# Patient Record
Sex: Male | Born: 2000 | Race: White | Hispanic: No | Marital: Single | Smoking: Never smoker
Health system: Southern US, Community
[De-identification: ages and names within clinical notes are randomized; demographics above are authoritative.]

---
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
## 2020-02-05 ENCOUNTER — Institutional Professional Consult (permissible substitution): Payer: No Typology Code available for payment source | Admitting: Pediatrics

## 2020-02-21 ENCOUNTER — Other Ambulatory Visit: Payer: Self-pay | Admitting: Pediatrics

## 2020-02-22 NOTE — Telephone Encounter (Signed)
E-Prescribed sertraline 50 directly to  [facility] - [place], [place] - [phone] [place] [address] Phone: [phone] Fax: [phone]

## 2020-02-22 NOTE — Telephone Encounter (Signed)
Last visit 11/06/2019 next visit 03/15/2020

## 2020-02-24 ENCOUNTER — Other Ambulatory Visit: Payer: Self-pay | Admitting: Pediatrics

## 2020-02-24 NOTE — Telephone Encounter (Signed)
RX for above e-scribed and sent to pharmacy on record  [facility] - [place], [place] - [address] [place] [address] Phone: [phone] Fax: [phone]   Has appt 03/15/20

## 2020-03-15 ENCOUNTER — Ambulatory Visit (INDEPENDENT_AMBULATORY_CARE_PROVIDER_SITE_OTHER): Payer: 59 | Admitting: Pediatrics

## 2020-03-15 ENCOUNTER — Other Ambulatory Visit: Payer: Self-pay

## 2020-03-15 ENCOUNTER — Encounter: Payer: Self-pay | Admitting: Pediatrics

## 2020-03-15 VITALS — Ht 69.5 in | Wt 138.0 lb

## 2020-03-15 DIAGNOSIS — R278 Other lack of coordination: Secondary | ICD-10-CM

## 2020-03-15 DIAGNOSIS — F902 Attention-deficit hyperactivity disorder, combined type: Secondary | ICD-10-CM

## 2020-03-15 DIAGNOSIS — Z79899 Other long term (current) drug therapy: Secondary | ICD-10-CM

## 2020-03-15 DIAGNOSIS — Z7189 Other specified counseling: Secondary | ICD-10-CM

## 2020-03-15 DIAGNOSIS — Z719 Counseling, unspecified: Secondary | ICD-10-CM | POA: Diagnosis not present

## 2020-03-15 MED ORDER — ATOMOXETINE HCL 60 MG PO CAPS: 60.0000 mg | ORAL_CAPSULE | Freq: Every morning | ORAL | 4 refills | Status: DC

## 2020-03-15 MED ORDER — SERTRALINE HCL 50 MG PO TABS: 50.0000 mg | ORAL_TABLET | Freq: Every day | ORAL | 4 refills | Status: DC

## 2020-03-15 NOTE — Progress Notes (Signed)
Medication Check  Patient ID: Randy Allison  DOB: [birth year]  MRN: [id number]  DATE:03/15/20 Randy Salmon, MD  Accompanied by: Father Patient Lives with: mother, father and sister age 19  HISTORY/CURRENT STATUS: Chief Complaint - Polite and cooperative and present for medical follow up for medication management of ADHD, dysgraphia and learning differences. Last follow up November 06, 2019 and currently prescribed Strattera 60 mg and Zoloft 50 mg 1 1/2 tablets.  Recently had braces removed.   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  No Sleep concerns.  Elimination: No concerns  Individual Medical History/ Review of Systems: Changes? :No Has Girl friend for about 3 months, will be doing [place] on-line  Family Medical/ Social History: Changes? No  Current Medications:  Strattera 60 mg Zoloft 50 mg one and half daily Medication Side Effects: None  MENTAL HEALTH: Mental Health Issues:  Denies sadness, loneliness or depression. No self harm or thoughts of self harm or injury. Denies fears, worries and anxieties. Has good peer relations and is not a bully nor is victimized.  Review of Systems  Constitutional: Negative.   HENT: Negative.   Eyes: Negative.   Respiratory: Negative.   Cardiovascular: Negative.   Gastrointestinal: Negative.   Genitourinary: Negative.   Musculoskeletal: Negative.   Skin: Negative.   Allergic/Immunologic: Negative.   Neurological:  Negative for tremors, seizures, syncope, speech difficulty and headaches.  Psychiatric/Behavioral: Positive for sleep disturbance. Negative for agitation, behavioral problems, confusion, decreased concentration, dysphoric mood, hallucinations and suicidal ideas. The patient is nervous/anxious. The patient is not hyperactive.   All other systems reviewed and are negative.   PHYSICAL EXAM; Vitals:   03/15/20 1508  Weight: 138 lb (62.6 kg)  Height: 5' 9.5" (1.765 m)   Body mass index is 20.09 kg/m.  General Physical Exam: Unchanged from previous exam, date:11/06/19   DIAGNOSES:    ICD-10-CM   1. ADHD (attention deficit hyperactivity disorder), combined type  F90.2   2. Dysgraphia  R27.8   3. Medication management  Z79.899   4. Patient counseled  Z71.9   5. Parenting dynamics counseling  Z71.89   6. Counseling and coordination of care  Z71.89     RECOMMENDATIONS:  Patient Instructions  DISCUSSION: Counseled regarding the following coordination of care items:  Continue medication as directed Strattera 60 mg Zoloft 50 mg 1-2 daily  RX for above e-scribed and sent to pharmacy on record  [facility] - [place], [place] - [address] [place] [address] Phone: [phone] Fax: [phone]   Counseled regarding obtaining refills by calling pharmacy first to use automated refill request then if needed, call our office leaving a detailed message on the refill line.  Counseled medication administration, effects, and possible side effects.  ADHD medications discussed to include different medications and pharmacologic properties of each. Recommendation for specific medication to include dose, administration, expected effects, possible side effects and the risk to benefit ratio of medication management.  Advised importance of:  Good sleep hygiene (8- 10  hours per night)  Limited screen time (none on school nights, no more than 2 hours on  weekends)  Regular exercise(outside and active play)  Healthy eating (drink water, no sodas/sweet tea)  Regular family meals have been linked to lower levels of adolescent risk-taking behavior.  Adolescents who frequently eat meals with their family are less likely to engage in risk behaviors than those who never or rarely eat with their families.  So it is never too early to start this tradition.  Counseling at this visit included the review of old records and/or current chart.   Counseling included the following discussion points presented at every visit to improve understanding and treatment compliance.  Recent health history and today's examination Growth and development with anticipatory guidance provided regarding brain growth, executive function maturation and pre or pubertal development. School progress and continued advocay for appropriate accommodations to include maintain Structure, routine, organization, reward, motivation and consequences.  Additionally the patient was counseled to take medication while driving.     Father verbalized understanding of all topics discussed.  NEXT APPOINTMENT:  Return in about 6 months (around 09/15/2020) for Medical Follow up.  Medical Decision-making: More than 50% of the appointment was spent counseling and discussing diagnosis and management of symptoms with the patient and family.  Counseling Time: 25 minutes Total Contact Time: 30 minutes

## 2020-03-15 NOTE — Patient Instructions (Signed)
DISCUSSION: Counseled regarding the following coordination of care items:  Continue medication as directed Strattera 60 mg Zoloft 50 mg 1-2 daily  RX for above e-scribed and sent to pharmacy on record  [facility] - [place],  - [address] [place] [address] Phone: [phone] Fax: [phone]   Counseled regarding obtaining refills by calling pharmacy first to use automated refill request then if needed, call our office leaving a detailed message on the refill line.  Counseled medication administration, effects, and possible side effects.  ADHD medications discussed to include different medications and pharmacologic properties of each. Recommendation for specific medication to include dose, administration, expected effects, possible side effects and the risk to benefit ratio of medication management.  Advised importance of:  Good sleep hygiene (8- 10 hours per night)  Limited screen time (none on school nights, no more than 2 hours on weekends)  Regular exercise(outside and active play)  Healthy eating (drink water, no sodas/sweet tea)  Regular family meals have been linked to lower levels of adolescent risk-taking behavior.  Adolescents who frequently eat meals with their family are less likely to engage in risk behaviors than those who never or rarely eat with their families.  So it is never too early to start this tradition.  Counseling at this visit included the review of old records and/or current chart.   Counseling included the following discussion points presented at every visit to improve understanding and treatment compliance.  Recent health history and today's examination Growth and development with anticipatory guidance provided regarding brain growth, executive function maturation and pre or pubertal development. School progress and continued advocay for appropriate accommodations to include maintain Structure,  routine, organization, reward, motivation and consequences.  Additionally the patient was counseled to take medication while driving.

## 2020-03-23 ENCOUNTER — Other Ambulatory Visit: Payer: Self-pay | Admitting: Pediatrics

## 2020-08-16 ENCOUNTER — Institutional Professional Consult (permissible substitution): Payer: 59 | Admitting: Pediatrics

## 2020-08-24 ENCOUNTER — Other Ambulatory Visit: Payer: Self-pay

## 2020-08-24 ENCOUNTER — Encounter: Payer: Self-pay | Admitting: Pediatrics

## 2020-08-24 ENCOUNTER — Ambulatory Visit (INDEPENDENT_AMBULATORY_CARE_PROVIDER_SITE_OTHER): Payer: 59 | Admitting: Pediatrics

## 2020-08-24 VITALS — Ht 69.5 in | Wt 143.0 lb

## 2020-08-24 DIAGNOSIS — R278 Other lack of coordination: Secondary | ICD-10-CM

## 2020-08-24 DIAGNOSIS — F902 Attention-deficit hyperactivity disorder, combined type: Secondary | ICD-10-CM | POA: Diagnosis not present

## 2020-08-24 DIAGNOSIS — F411 Generalized anxiety disorder: Secondary | ICD-10-CM | POA: Diagnosis not present

## 2020-08-24 DIAGNOSIS — Z7189 Other specified counseling: Secondary | ICD-10-CM

## 2020-08-24 DIAGNOSIS — Z719 Counseling, unspecified: Secondary | ICD-10-CM

## 2020-08-24 DIAGNOSIS — Z79899 Other long term (current) drug therapy: Secondary | ICD-10-CM

## 2020-08-24 MED ORDER — ATOMOXETINE HCL 60 MG PO CAPS: 60.0000 mg | ORAL_CAPSULE | Freq: Every morning | ORAL | 2 refills | Status: DC

## 2020-08-24 MED ORDER — SERTRALINE HCL 50 MG PO TABS: 25.0000 mg | ORAL_TABLET | Freq: Every day | ORAL | 2 refills | Status: DC

## 2020-08-24 NOTE — Patient Instructions (Signed)
DISCUSSION: Counseled regarding the following coordination of care items:  Continue medication as directed Strattera 60 mg every day Sertraline 25 mg every morning RX for above e-scribed and sent to pharmacy on record  [facility] - [place], [place] - [address] [place] [address] Phone: [phone] Fax: [phone]   Will keep medication the same until results of PGT swab are returned, sample sent this date due to challenges with medication, response and Side effects.  Counseled regarding obtaining refills by calling pharmacy first to use automated refill request then if needed, call our office leaving a detailed message on the refill line.  Counseled medication administration, effects, and possible side effects.  ADHD medications discussed to include different medications and pharmacologic properties of each. Recommendation for specific medication to include dose, administration, expected effects, possible side effects and the risk to benefit ratio of medication management.  Advised importance of:  Good sleep hygiene (8- 10 hours per night)  Limited screen time (none on school nights, no more than 2 hours on weekends)  Regular exercise(outside and active play)  Healthy eating (drink water, no sodas/sweet tea)  Counseling at this visit included the review of old records and/or current chart.   Counseling included the following discussion points presented at every visit to improve understanding and treatment compliance.  Recent health history and today's examination Growth and development with anticipatory guidance provided regarding brain growth, executive function maturation and pre or pubertal development. School progress and continued advocay for appropriate accommodations to include maintain Structure, routine, organization, reward, motivation and consequences.  Additionally the patient was counseled to take medication while  driving.

## 2020-08-24 NOTE — Progress Notes (Signed)
Medication Check  Patient ID: Randy Allison  DOB: [birth year]  MRN: [id number]  DATE:08/24/20 Randy Salmon, MD  Accompanied by: Mother Patient Lives with: mother and father  HISTORY/CURRENT STATUS: Chief Complaint - Polite and cooperative and present for medical follow up for medication management of ADHD, dysgraphia and  Anxiety with episodic depression. Last follow up on 03/15/2020.  Currently prescribed Strattera 60 mg and Zoloft 25 mg daily.  Has emailed with request to return to baseline and reassess medication, but stress and response is not flaring at present.  On break from school.  When was during school, would feel anxiety and would increase zoloft and feel foggy.  Whenever skipped strattera would feel headache and feel badly.  Would like to adjust medication to have a better response but is afraid to do so due to feeling badly and returning to college.  May wish to adjust over summer.  Maternal history of significantly reduced MTHFR.  EDUCATION: School: [employer] Year/Grade: Freshman Variable up and down with depression through the first semester, would not want to do things for about four to five days and did impact school work, was able to get it adjusted with some emotions but powered through. Zero stress right now.  Passed all classes - took Research scientist (medical), seminar, Am Hist, [place], mass media, writing  Mass media was stressful due to not sure of grades Advised needs DSO at school, has not yet submitted paperwork  Activities/ Exercise: daily  Screen time: (phone, tablet, TV, computer): not excessive  Driving: going well  MEDICAL HISTORY: Appetite: WNL   Sleep: Bedtime: good now  Awakens: variable   Concerns: Initiation/Maintenance/Other: Asleep easily, sleeps through the night, feels well-rested.  No Sleep concerns.  Elimination: no concerns  Individual Medical History/ Review of Systems: Changes? :No  Family Medical/ Social History: Changes? No  Current Medications:   strattera 60 mg every morning Sertraline 25 mg daily Medication Side Effects: None  MENTAL HEALTH: Mental Health Issues:  Denies sadness, loneliness or depression. No self harm or thoughts of self harm or injury. Denies fears, worries and anxieties. Has good peer relations and is not a bully nor is victimized.  Review of Systems  Constitutional: Negative.   HENT: Negative.   Eyes: Negative.   Respiratory: Negative.   Cardiovascular: Negative.   Gastrointestinal: Negative.   Genitourinary: Negative.   Musculoskeletal: Negative.   Skin: Negative.   Allergic/Immunologic: Negative.   Neurological: Negative for tremors, seizures, syncope, speech difficulty and headaches.  Psychiatric/Behavioral: Negative for agitation, behavioral problems, confusion, decreased concentration, dysphoric mood, hallucinations, sleep disturbance and suicidal ideas. The patient is nervous/anxious. The patient is not hyperactive.   All other systems reviewed and are negative.   PHYSICAL EXAM; Vitals:   08/24/20 0935  Weight: 143 lb (64.9 kg)  Height: 5' 9.5" (1.765 m)   Body mass index is 20.81 kg/m.  General Physical Exam: Unchanged from previous exam, date:03/15/20   DIAGNOSES:    ICD-10-CM   1. ADHD (attention deficit hyperactivity disorder), combined type  F90.2 Pharmacogenomic Testing/PersonalizeDx  2. Dysgraphia  R27.8 Pharmacogenomic Testing/PersonalizeDx  3. Generalized anxiety disorder  F41.1 Pharmacogenomic Testing/PersonalizeDx  4. Medication management  Z79.899   5. Patient counseled  Z71.9   6. Parenting dynamics counseling  Z71.89   7. Counseling and coordination of care  Z71.89     RECOMMENDATIONS:  Patient Instructions  DISCUSSION: Counseled regarding the following coordination of care items:  Continue medication as directed Strattera 60 mg every day Sertraline 25 mg every morning RX  for above e-scribed and sent to pharmacy on record  [facility]  - [place], [place] - [address] [place] [address] Phone: [phone] Fax: [phone]   Will keep medication the same until results of PGT swab are returned, sample sent this date due to challenges with medication, response and Side effects.  Counseled regarding obtaining refills by calling pharmacy first to use automated refill request then if needed, call our office leaving a detailed message on the refill line.  Counseled medication administration, effects, and possible side effects.  ADHD medications discussed to include different medications and pharmacologic properties of each. Recommendation for specific medication to include dose, administration, expected effects, possible side effects and the risk to benefit ratio of medication management.  Advised importance of:  Good sleep hygiene (8- 10 hours per night)  Limited screen time (none on school nights, no more than 2 hours on weekends)  Regular exercise(outside and active play)  Healthy eating (drink water, no sodas/sweet tea)  Counseling at this visit included the review of old records and/or current chart.   Counseling included the following discussion points presented at every visit to improve understanding and treatment compliance.  Recent health history and today's examination Growth and development with anticipatory guidance provided regarding brain growth, executive function maturation and pre or pubertal development. School progress and continued advocay for appropriate accommodations to include maintain Structure, routine, organization, reward, motivation and consequences.  Additionally the patient was counseled to take medication while driving.      Mother verbalized understanding of all topics discussed.  NEXT APPOINTMENT:  Return in about 3 months (around 11/22/2020) for Medication Check.  Medical Decision-making: More than 50% of the appointment was spent counseling and discussing  diagnosis and management of symptoms with the patient and family.  Counseling Time: 40 minutes Total Contact Time: 50 minutes

## 2020-08-25 ENCOUNTER — Other Ambulatory Visit (HOSPITAL_COMMUNITY): Payer: Self-pay | Admitting: Medical

## 2020-08-25 DIAGNOSIS — R1013 Epigastric pain: Secondary | ICD-10-CM

## 2020-08-29 ENCOUNTER — Other Ambulatory Visit: Payer: Self-pay

## 2020-08-29 ENCOUNTER — Ambulatory Visit (INDEPENDENT_AMBULATORY_CARE_PROVIDER_SITE_OTHER): Payer: 59

## 2020-08-29 DIAGNOSIS — R1013 Epigastric pain: Secondary | ICD-10-CM | POA: Diagnosis not present

## 2020-08-31 ENCOUNTER — Telehealth: Payer: Self-pay | Admitting: Pediatrics

## 2020-08-31 NOTE — Telephone Encounter (Signed)
Emailed mother and patient PGT report.  No changes at present and MTHFR activity is normal.  

## 2020-11-25 ENCOUNTER — Other Ambulatory Visit: Payer: Self-pay

## 2020-11-25 ENCOUNTER — Encounter: Payer: Self-pay | Admitting: Pediatrics

## 2020-11-25 ENCOUNTER — Ambulatory Visit (INDEPENDENT_AMBULATORY_CARE_PROVIDER_SITE_OTHER): Payer: 59 | Admitting: Pediatrics

## 2020-11-25 VITALS — Ht 69.5 in | Wt 140.0 lb

## 2020-11-25 DIAGNOSIS — F902 Attention-deficit hyperactivity disorder, combined type: Secondary | ICD-10-CM

## 2020-11-25 DIAGNOSIS — R278 Other lack of coordination: Secondary | ICD-10-CM | POA: Diagnosis not present

## 2020-11-25 DIAGNOSIS — Z79899 Other long term (current) drug therapy: Secondary | ICD-10-CM | POA: Diagnosis not present

## 2020-11-25 DIAGNOSIS — Z719 Counseling, unspecified: Secondary | ICD-10-CM

## 2020-11-25 MED ORDER — ATOMOXETINE HCL 60 MG PO CAPS: 60.0000 mg | ORAL_CAPSULE | Freq: Every morning | ORAL | 2 refills | Status: DC

## 2020-11-25 NOTE — Patient Instructions (Signed)
DISCUSSION: Counseled regarding the following coordination of care items:  Continue medication as directed Strattera 60 mg every day RX for above e-scribed and sent to pharmacy on record  [facility] - [place], [place] - [address] [place] [address] Phone: [phone] Fax: [phone]   Discontinue sertraline   Advised importance of:  Sleep Continue with good sleep hygiene with bedtime no later than 2400  Discuss loss of taste and smell due to Covid with PCP

## 2020-11-25 NOTE — Progress Notes (Signed)
Medication Check  Patient ID: Randy Allison  DOB: [birth year]  MRN: [id number]  DATE:11/25/20 [name], MD  Accompanied by: Self Patient Lives with: mother and father  HISTORY/CURRENT STATUS: Chief Complaint - Polite and cooperative and present for medical follow up for medication management of ADHD, dysgraphia and anxiety.  Last visit 08/24/2020.  Had discussed wanting to wean off of sertraline at that time.  Completed a 2-week wean off and did describe having experienced dread and strong emotions while weaning and completely off had two weeks of this random feeling.  Now has had no sertraline since end of February, off five weeks.  Early mid march last intense emotions.  Feels he is doing well being aware of and controlling his emotions.  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 No labs Majoring Business Admin All A except [place] (pulling it up and waiting for grades to get in)  Activities/ Exercise: daily  Hanging with friends, participates in social engagement  [employer] - four applications for work [employer], Lauris Poag, [employer], Karin Golden Was at [employer], register, low pay  Screen time: (phone, tablet, TV, computer): not excessive  Driving: going well  MEDICAL HISTORY: Appetite: WNL   Sleep: Bedtime: School 2300-2400  Awakens: 0930   Concerns: Initiation/Maintenance/Other: Asleep easily, sleeps through the night, feels well-rested.  No Sleep concerns.  Elimination: no concerns  Individual Medical History/ Review of Systems: Changes? :No Fully vaccinated Family Medical/ Social History: Changes? No  MENTAL HEALTH: Denies sadness, loneliness or depression.  Feels level and can be aware and control those feelings. Denies self harm or thoughts of self harm or  injury. Denies fears, worries and anxieties. School work related stress Has good peer relations and is not a bully nor is victimized. Has GF, good relationship.  PHYSICAL EXAM; Vitals:   11/25/20 1511  Weight: 140 lb (63.5 kg)  Height: 5' 9.5" (1.765 m)   Body mass index is 20.38 kg/m.  General Physical Exam: Unchanged from previous exam, date:08/24/20   ASSESSMENT:  Randy Allison is a 20 year old with a diagnosis of ADHD and dysgraphia with resolving anxiety.  Symptoms are improved and well controlled with current Strattera 60 mg every day. He will contact PCP for lingering loss of sense of smell and taste due to past Covid.   ADHD stable with medication management   DIAGNOSES:    ICD-10-CM   1. ADHD (attention deficit hyperactivity disorder), combined type  F90.2   2. Dysgraphia  R27.8   3. Medication management  Z79.899   4. Patient counseled  Z71.9     RECOMMENDATIONS:  Patient Instructions  DISCUSSION: Counseled regarding the following coordination of care items:  Continue medication as directed Strattera 60 mg every day RX for above e-scribed and sent to pharmacy on record  [facility] - [place], [place] - [address] [place] [address] Phone: [phone] Fax: [phone]   Discontinue sertraline   Advised importance of:  Sleep Continue with good sleep hygiene with bedtime no later than 2400  Discuss loss of taste and smell due to Covid with PCP      Patient verbalized understanding of all topics discussed.  NEXT APPOINTMENT:  Return in about 6 months (around 05/27/2021) for Medication Check.

## 2021-05-01 ENCOUNTER — Other Ambulatory Visit: Payer: Self-pay | Admitting: Pediatrics

## 2021-05-02 NOTE — Telephone Encounter (Signed)
RX for above e-scribed and sent to pharmacy on record    [id number] - [place], [place] - [address] [place] [address] Phone: [phone] Fax: [phone]   

## 2021-05-29 ENCOUNTER — Encounter: Payer: Self-pay | Admitting: Pediatrics

## 2021-05-29 ENCOUNTER — Other Ambulatory Visit: Payer: Self-pay

## 2021-05-29 ENCOUNTER — Telehealth (INDEPENDENT_AMBULATORY_CARE_PROVIDER_SITE_OTHER): Payer: 59 | Admitting: Pediatrics

## 2021-05-29 DIAGNOSIS — F902 Attention-deficit hyperactivity disorder, combined type: Secondary | ICD-10-CM | POA: Diagnosis not present

## 2021-05-29 DIAGNOSIS — Z79899 Other long term (current) drug therapy: Secondary | ICD-10-CM

## 2021-05-29 DIAGNOSIS — Z719 Counseling, unspecified: Secondary | ICD-10-CM | POA: Diagnosis not present

## 2021-05-29 DIAGNOSIS — R278 Other lack of coordination: Secondary | ICD-10-CM | POA: Diagnosis not present

## 2021-05-29 NOTE — Patient Instructions (Signed)
DISCUSSION: Counseled regarding the following coordination of care items:  Continue medication as directed Strattera 60 mg daily No refills as they were recently submitted 04/28/2021   Advised importance of:  Sleep Maintain good routines avoiding late nights Limited screen time (none on school nights, no more than 2 hours on weekends) Always reduce screen time Regular exercise(outside and active play) More physical active outside activities and social activities Healthy eating (drink water, no sodas/sweet tea) Protein rich avoiding junk food and empty calories

## 2021-05-29 NOTE — Progress Notes (Signed)
[facility] [address] [place] [address] Dept: [phone] Dept Fax: [phone]  Medication Check by Caregility due to COVID-19  Patient ID:  Randy Allison  male DOB: 22-Sep-2000   20 y.o.   MRN: [id number]   DATE:05/29/21  PCP: [name], MD  Interviewed: Randy Allison  Location: [employer], empty classroom, no others present Provider location: [facility] office  Virtual Visit via Video Note Connected with Randy Allison on 05/29/21 at  3:30 PM EDT by video enabled telemedicine application and verified that I am speaking with the correct person using two identifiers.     I discussed the limitations, risks, security and privacy concerns of performing an evaluation and management service by telephone and the availability of in person appointments. I also discussed with the parent/patient that there may be a patient responsible charge related to this service. The parent/patient expressed understanding and agreed to proceed.  HISTORY OF PRESENT ILLNESS/CURRENT STATUS: Randy Allison is being followed for medication management for ADHD, dysgraphia and learning differences .   Last visit on 11/25/20  Brae currently prescribed Strattera 60 mg daily, doing well    Behaviors: Reports good focus and attention.  Some stress right now with midterms but otherwise wants no medication changes  Eating well (eating breakfast, lunch and dinner).   Elimination: No concerns  Sleeping: bedtime variable - usually no later than 2400 pm  Usual up by 0700 daily Sleeping through the night.   EDUCATION: School: [employer] Year/Grade: sophomore Two business, christianity, business small, Audiological scientist.  Activities/ Exercise: daily Walking to [employer] Other business class  Screen time: (phone, tablet, TV, computer): non-essential, not excessive  MEDICAL HISTORY: Individual  Medical History/ Review of Systems: Changes? :No Slightly improved sense of smell and certain foods seem just awful.  Had Covid about 18 months ago. No other symptoms just loss of smell.  Family Medical/ Social History: Changes? No   Patient Lives with: roommate - was a suite mate, now a roommate. Gerilyn Pilgrim - working out well.  MENTAL HEALTH: Denies sadness, loneliness or depression.  Denies self harm or thoughts of self harm or injury. Some fears, worries and anxieties - mid terms right Has good peer relations and is not a bully nor is victimized.  ASSESSMENT:  Randy Allison is a 20 year old with a diagnosis of ADHD/dysgraphia with learning differences and anxiety that is well controlled with current medication.  No medication changes.  We discussed stress reduction that includes always maintaining good sleep routines and getting good diet with good exercise daily.  Engaging in social activities and activities for fun.  He is in a good roommate situation and that is not a source of stress. ADHD stable with medication management Has Appropriate school accommodations with progress academically  DIAGNOSES:    ICD-10-CM   1. ADHD (attention deficit hyperactivity disorder), combined type  F90.2     2. Dysgraphia  R27.8     3. Medication management  Z79.899     4. Patient counseled  Z71.9        RECOMMENDATIONS:  Patient Instructions  DISCUSSION: Counseled regarding the following coordination of care items:  Continue medication as directed Strattera 60 mg daily No refills as they were recently submitted 04/28/2021   Advised importance of:  Sleep Maintain good routines avoiding late nights Limited screen time (none on school nights, no more than 2 hours on weekends) Always reduce screen time Regular exercise(outside and  active play) More physical active outside activities and social activities Healthy eating (drink water, no sodas/sweet tea) Protein rich avoiding junk food and empty  calories     NEXT APPOINTMENT:  No follow-ups on file. Please call the office for a sooner appointment if problems arise.  Medical Decision-making:  I spent 14 minutes dedicated to the care of this patient on the date of this encounter to include face to face time with the patient and/or parent reviewing medical records and documentation by teachers, performing and discussing the assessment and treatment plan, reviewing and explaining completed speciality labs and obtaining specialty lab samples.  The patient and/or parent was provided an opportunity to ask questions and all were answered. The patient and/or parent agreed with the plan and demonstrated an understanding of the instructions.   The patient and/or parent was advised to call back or seek an in-person evaluation if the symptoms worsen or if the condition fails to improve as anticipated.  I provided 14 minutes of non-face-to-face time during this encounter.   Completed record review for 5 minutes prior to and after the virtual visit.   Disclaimer: This documentation was generated through the use of dictation and/or voice recognition software, and as such, may contain spelling or other transcription errors. Please disregard any inconsequential errors.  Any questions regarding the content of this documentation should be directed to the individual who electronically signed.

## 2021-08-09 ENCOUNTER — Other Ambulatory Visit: Payer: Self-pay | Admitting: Pediatrics

## 2021-08-10 NOTE — Telephone Encounter (Signed)
RX for above e-scribed and sent to pharmacy on record    [id number] - [place], [place] - [address] [place]  [address] Phone: [phone] Fax: [phone]   

## 2021-11-03 ENCOUNTER — Encounter: Payer: Self-pay | Admitting: Pediatrics

## 2021-11-08 ENCOUNTER — Other Ambulatory Visit: Payer: Self-pay | Admitting: Pediatrics

## 2021-11-08 MED ORDER — ATOMOXETINE HCL 60 MG PO CAPS: 60.0000 mg | ORAL_CAPSULE | Freq: Every morning | ORAL | 2 refills | Status: DC

## 2021-11-08 NOTE — Telephone Encounter (Signed)
RX for above e-scribed and sent to pharmacy on record    [id number] - [place], [place] - [address] [place] [address] Phone: [phone] Fax: [phone]   

## 2021-11-20 ENCOUNTER — Other Ambulatory Visit: Payer: Self-pay | Admitting: Pediatrics

## 2021-11-20 ENCOUNTER — Telehealth: Payer: Self-pay | Admitting: Nurse Practitioner

## 2021-11-20 MED ORDER — ATOMOXETINE HCL 40 MG PO CAPS: 40.0000 mg | ORAL_CAPSULE | Freq: Every day | ORAL | 0 refills | Status: DC

## 2021-11-20 NOTE — Telephone Encounter (Signed)
Duplicate note

## 2021-11-20 NOTE — Telephone Encounter (Signed)
Patient requesting lower dose ?Strattera 40 mg every morning ? ?RX for above e-scribed and sent to pharmacy on record ? ?[id number] - [place], [place] ?[place] ?[place] [address] ?Phone: [phone] Fax: [phone] ? ? ?

## 2021-12-17 ENCOUNTER — Other Ambulatory Visit: Payer: Self-pay | Admitting: Pediatrics

## 2021-12-18 NOTE — Telephone Encounter (Signed)
RX for above e-scribed and sent to pharmacy on record  [id number] - [place], [place] - [address] [place] [address] Phone: [phone] Fax: [phone]   

## 2022-03-22 ENCOUNTER — Other Ambulatory Visit: Payer: Self-pay | Admitting: Pediatrics

## 2022-03-22 MED ORDER — ATOMOXETINE HCL 40 MG PO CAPS: 40.0000 mg | ORAL_CAPSULE | Freq: Every day | ORAL | 2 refills | Status: DC

## 2022-03-22 NOTE — Telephone Encounter (Signed)
RX for above e-scribed and sent to pharmacy on record    [id number] - [place], [place] - [address] [place] [address] Phone: [phone] Fax: [phone]   

## 2022-04-05 ENCOUNTER — Institutional Professional Consult (permissible substitution): Payer: 59 | Admitting: Pediatrics

## 2022-05-15 ENCOUNTER — Encounter: Payer: Self-pay | Admitting: Pediatrics

## 2022-05-15 ENCOUNTER — Telehealth (INDEPENDENT_AMBULATORY_CARE_PROVIDER_SITE_OTHER): Payer: 59 | Admitting: Pediatrics

## 2022-05-15 DIAGNOSIS — Z79899 Other long term (current) drug therapy: Secondary | ICD-10-CM

## 2022-05-15 DIAGNOSIS — F902 Attention-deficit hyperactivity disorder, combined type: Secondary | ICD-10-CM

## 2022-05-15 DIAGNOSIS — Z719 Counseling, unspecified: Secondary | ICD-10-CM

## 2022-05-15 MED ORDER — ATOMOXETINE HCL 40 MG PO CAPS: 40.0000 mg | ORAL_CAPSULE | Freq: Every day | ORAL | 1 refills | Status: AC

## 2022-05-15 MED ORDER — ATOMOXETINE HCL 40 MG PO CAPS: 40.0000 mg | ORAL_CAPSULE | Freq: Every day | ORAL | 1 refills | Status: DC

## 2022-05-15 NOTE — Patient Instructions (Addendum)
DISCUSSION: Counseled regarding the following coordination of care items:  Continue medication as directed Strattera 40 mg daily RX for above e-scribed and sent to pharmacy on record  [id number] - [place], [place] [address] [place] [address] Phone: [phone] Fax: [phone]  90-day supply of medication provided for this refill.  Patient is encouraged to reach out to me if insurance will not allow a 90-day supply.

## 2022-05-15 NOTE — Progress Notes (Signed)
Randy Allison [place] [place] [address] Dept: [phone] Dept Fax: [phone]  Medication Check by Caregility due to COVID-19  Patient ID:  Randy Allison  male DOB: 11/02/2000   21 y.o.   MRN: [id number]   DATE:05/15/22  Interviewed: Kellie Simmering [place]  Location: [facility]no other individuals present Provider location: [facility] office  Virtual Visit via Video Note Connected with Audra Bellard on 05/15/22 at  9:00 AM EDT by video enabled telemedicine application and verified that I am speaking with the correct person using two identifiers.     I discussed the limitations, risks, security and privacy concerns of performing an evaluation and management service by telephone and the availability of in person appointments. I also discussed with the parent/patient that there may be a patient responsible charge related to this service. The parent/patient expressed understanding and agreed to proceed.  HISTORY OF PRESENT ILLNESS/CURRENT STATUS: Randy Allison is being followed for medication management for ADHD and anxiety. Last visit on 05/29/2021 by video and last in person 11/25/2020  Arjen currently prescribed Strattera 40 mg every morning.    Behaviors: No behavioral concerns and requesting no medication changes.  He reports that the low-dose of Strattera is helpful for focus and he is not feeling any anxiety-like symptoms or concerns  Eating well (eating breakfast, lunch and dinner).  Counseled maintain good protein rich foods avoiding junk and empty calories Elimination: No concerns  Sleeping: No concerns sleeping through the night.  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 :No  Family Medical/ Social History: Changes? No    MENTAL HEALTH: Voice no concerns  ASSESSMENT:  Randy Allison is 83-years of age with a diagnosis of ADHD that is improved and well controlled with current medication.  No medication changes at this time. Anticipatory guidance with counseling and education provided to the patient during this visit as indicated in the note above.  We do anticipate one more visit before he turns 66 and I have requested he call and reschedule for a 3-month in person visit.  Transition services will be discussed at that time. ADHD stable with medication management  DIAGNOSES:    ICD-10-CM   1. ADHD (attention deficit hyperactivity disorder), combined type  F90.2     2. Medication management  Z79.899     3. Patient counseled  Z71.9        RECOMMENDATIONS:  Patient Instructions  DISCUSSION: Counseled regarding the following coordination of care items:  Continue medication as directed Strattera 40 mg daily RX for above e-scribed and sent to pharmacy on record  [id number] - [place], [place] - [place] [address] [place] [place] [address] Phone: [phone] Fax: [phone]  90-day supply of medication provided for this refill.  Patient is encouraged to reach out to me if insurance will not allow a 90-day supply.   NEXT APPOINTMENT:  Return in about 6 months (around 11/13/2022) for Medication Check. Please call the office for a sooner appointment if problems arise.  Medical Decision-making:  I spent 10 minutes  dedicated to the care of this patient on the date of this encounter to include face to face time with the patient and/or parent reviewing medical  records and documentation by teachers, performing and discussing the assessment and treatment plan, reviewing and explaining completed speciality labs and obtaining specialty lab samples.  The patient and/or parent was provided an opportunity to ask questions and all were answered. The patient and/or parent agreed with the plan and demonstrated an understanding of the instructions.   The patient and/or parent was advised to call back or seek an in-person evaluation if the symptoms worsen or if the condition fails to improve as anticipated.  I provided 10 minutes of video-face-to-face time during this encounter.   Completed record review for 5 minutes prior to and after the virtual visit.   Disclaimer: This documentation was generated through the use of dictation and/or voice recognition software, and as such, may contain spelling or other transcription errors. Please disregard any inconsequential errors.  Any questions regarding the content of this documentation should be directed to the individual who electronically signed.
